# Patient Record
Sex: Female | Born: 1980 | Hispanic: Yes | Marital: Married | Smoking: Never smoker
Health system: Southern US, Community
[De-identification: ages and names within clinical notes are randomized; demographics above are authoritative.]

## PROBLEM LIST (undated history)

## (undated) DIAGNOSIS — D369 Benign neoplasm, unspecified site: Secondary | ICD-10-CM

## (undated) DIAGNOSIS — K219 Gastro-esophageal reflux disease without esophagitis: Secondary | ICD-10-CM

## (undated) DIAGNOSIS — Z789 Other specified health status: Secondary | ICD-10-CM

## (undated) HISTORY — DX: Benign neoplasm, unspecified site: D36.9

---

## 2010-10-16 ENCOUNTER — Ambulatory Visit: Payer: Self-pay

## 2010-10-17 ENCOUNTER — Ambulatory Visit: Payer: Self-pay

## 2010-11-08 ENCOUNTER — Ambulatory Visit: Payer: Self-pay

## 2011-05-09 ENCOUNTER — Ambulatory Visit: Payer: Self-pay

## 2012-02-01 IMAGING — US ULTRASOUND LEFT BREAST
1 series · 17 of 17 positions shown · non-contrast
Comparison: none

REASON FOR EXAM: LT BRST NIPPLE DC [REDACTED]
COMMENTS:

[Series 1: ultrasound left breast · 17 of 17 slices shown]
[im 1/17]
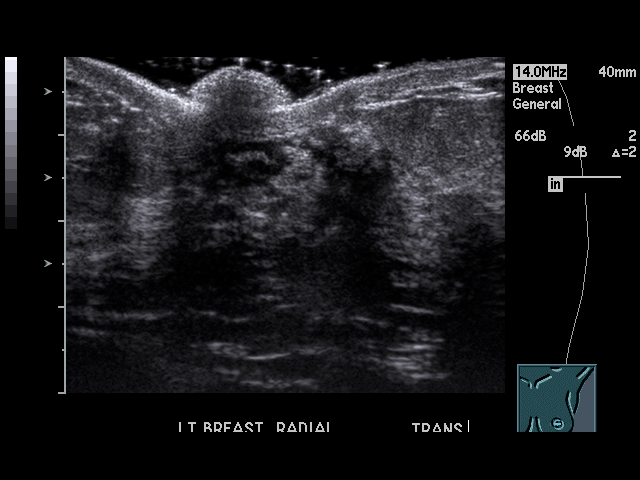
[im 2/17]
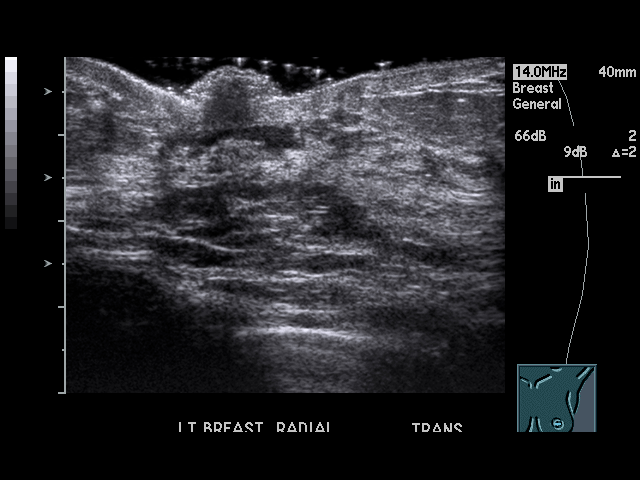
[im 3/17]
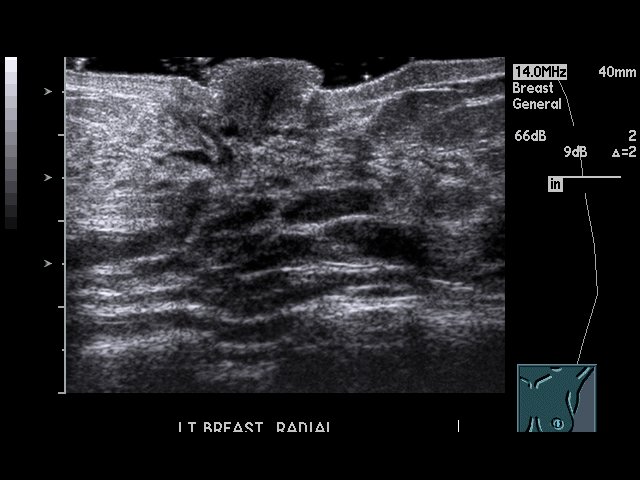
[im 4/17]
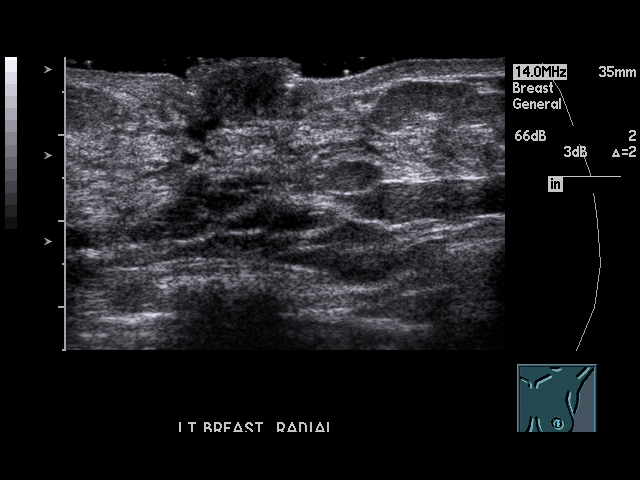
[im 5/17]
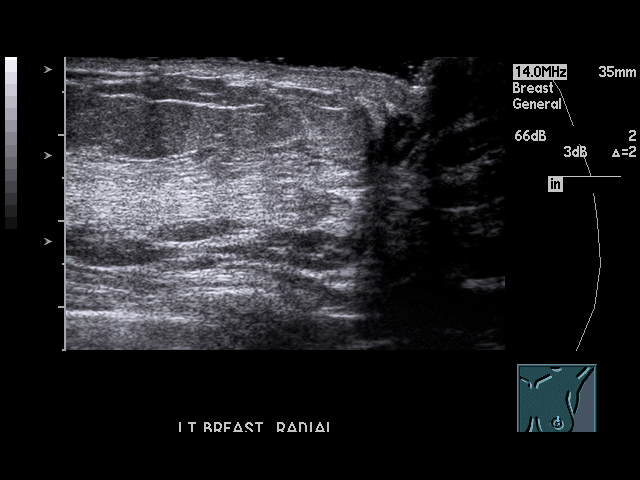
[im 6/17]
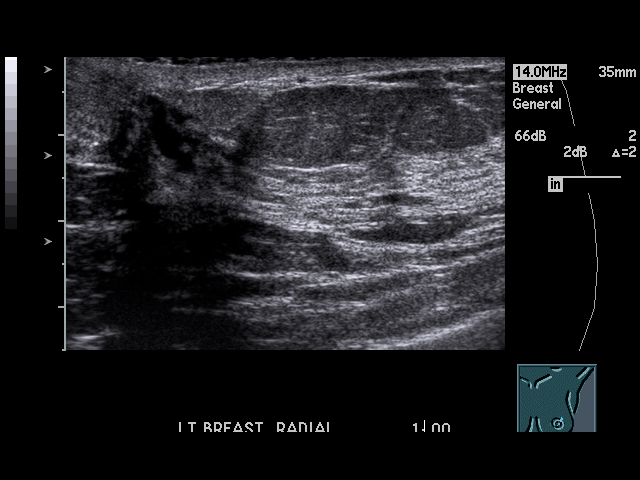
[im 7/17]
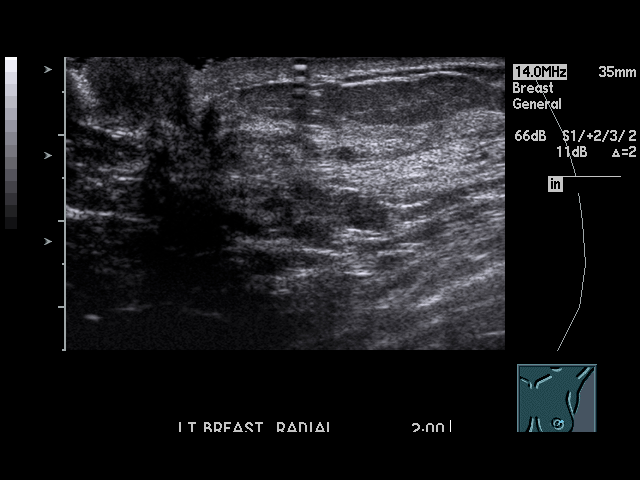
[im 8/17]
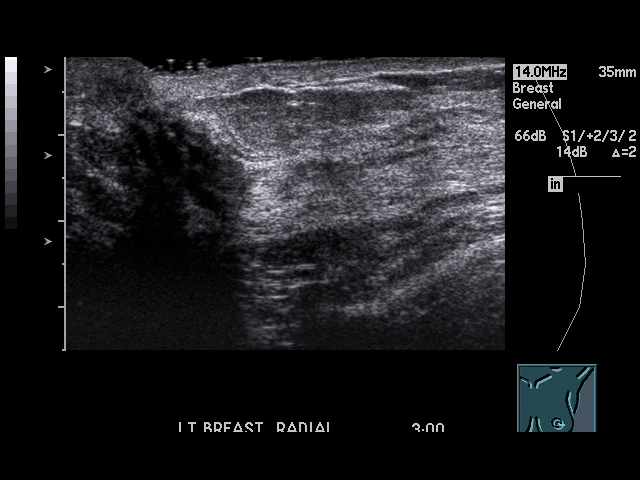
[im 9/17]
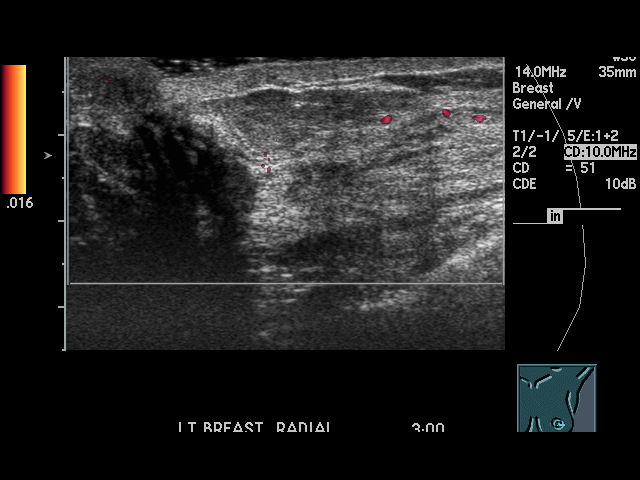
[im 10/17]
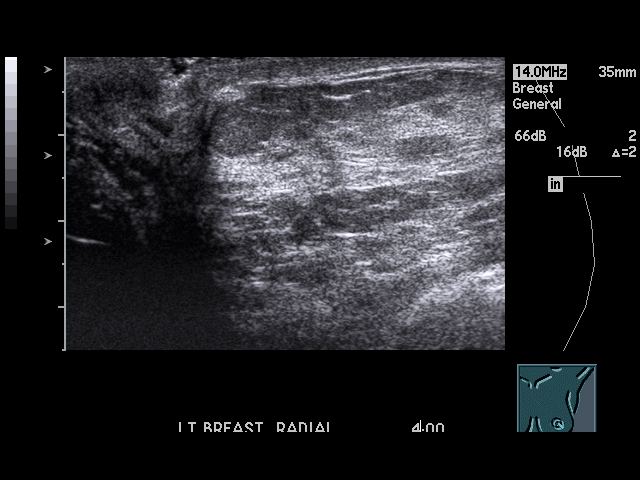
[im 11/17]
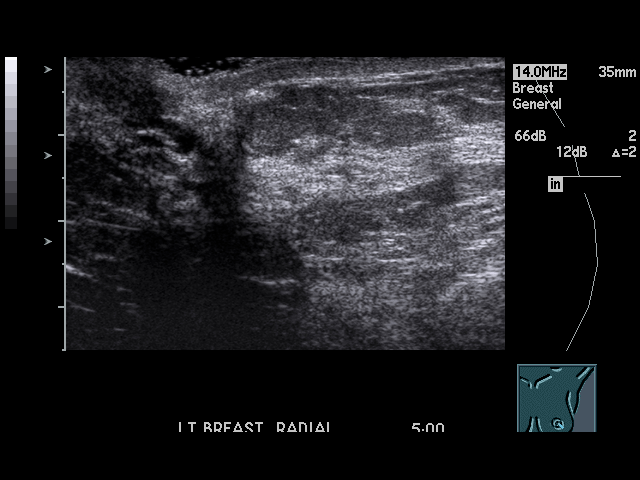
[im 12/17]
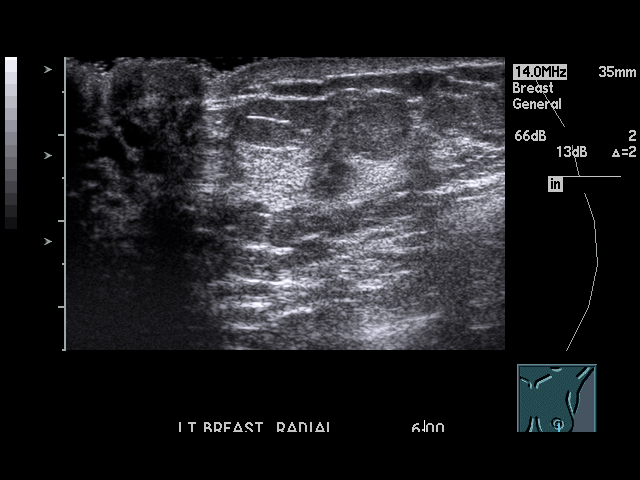
[im 13/17]
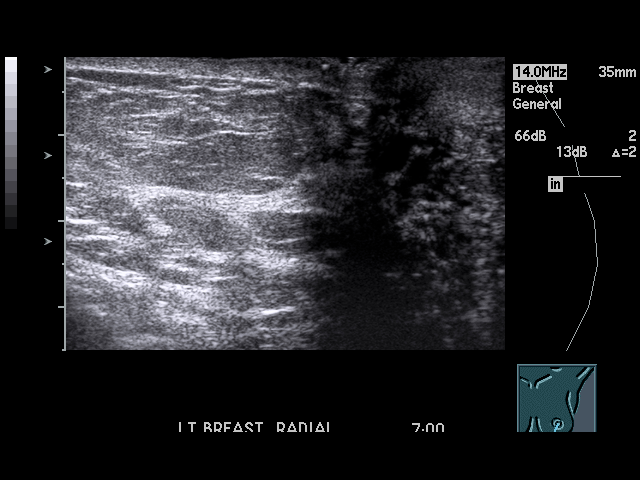
[im 14/17]
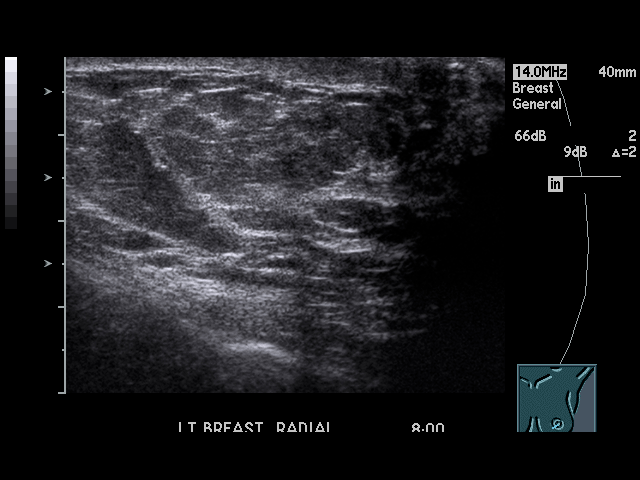
[im 15/17]
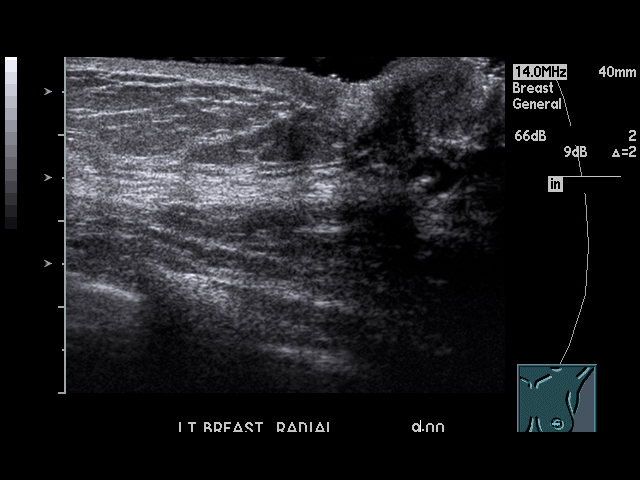
[im 16/17]
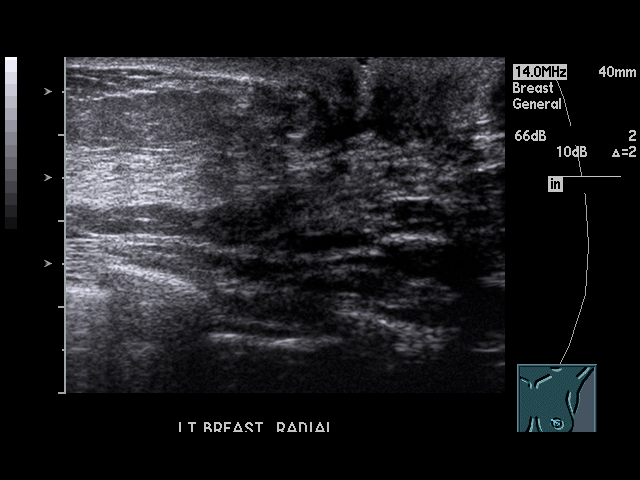
[im 17/17]
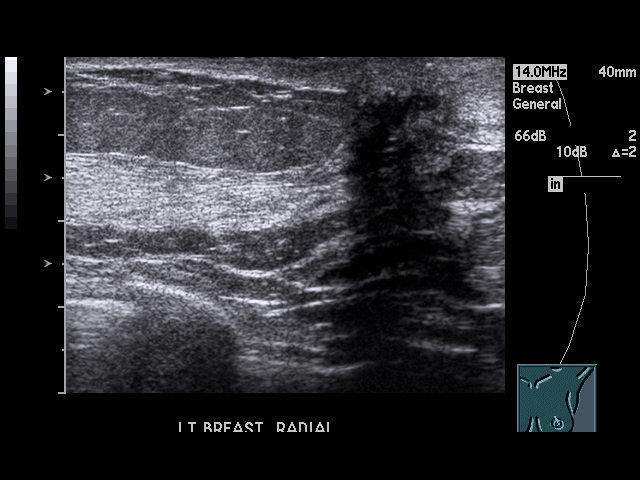

[17 of 17 positions shown; findings below may reference images not displayed]

PROCEDURE:     US  - US LT BREAST ([REDACTED])  - October 17, 2010 [DATE]

RESULT:     The left breast was evaluated in the region of interest in the
periareolar region.  Dilated ducts are identified in the region of the
nipple. Otherwise, no solid or cystic sonographic abnormalities are
identified.
IMPRESSION: 1.Unremarkable focused left breast ultrasound.

## 2012-08-23 IMAGING — US ULTRASOUND LEFT BREAST
1 series · 10 of 10 positions shown · non-contrast
Comparison: none

REASON FOR EXAM: Left Breast Nodule Density Follow Up
COMMENTS:

[Series 1: ultrasound left breast · 10 of 10 slices shown]
[im 1/10]
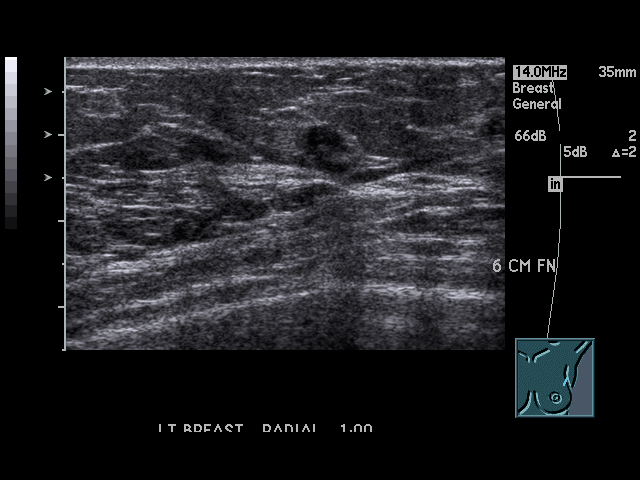
[im 2/10]
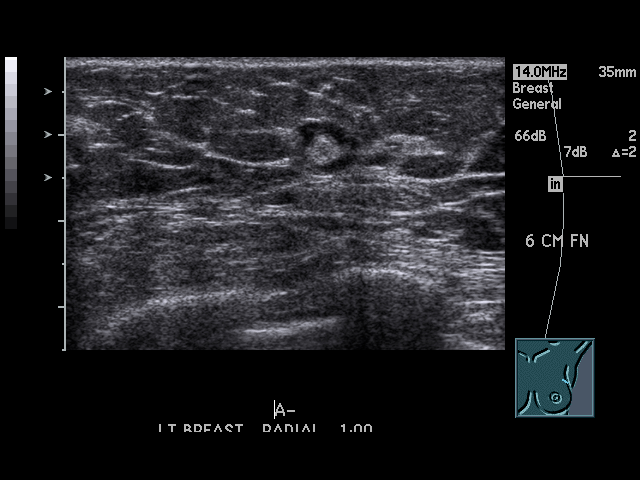
[im 3/10]
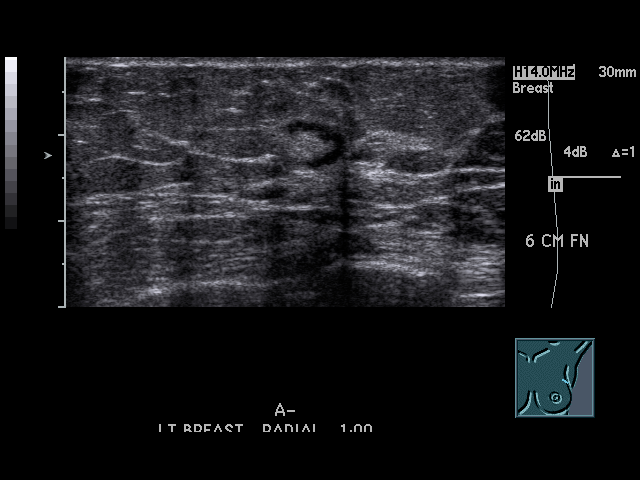
[im 4/10]
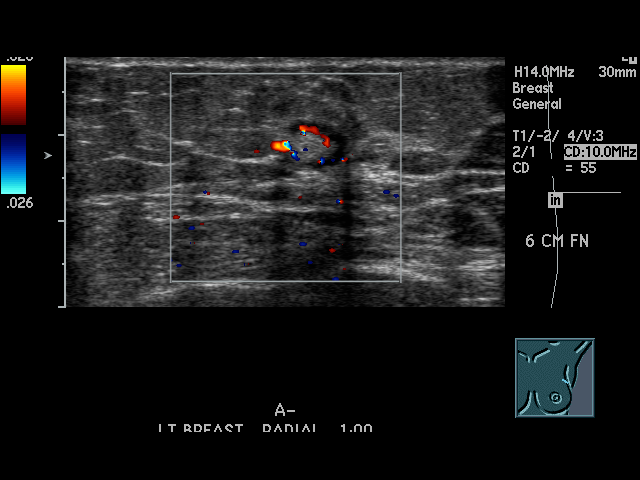
[im 5/10]
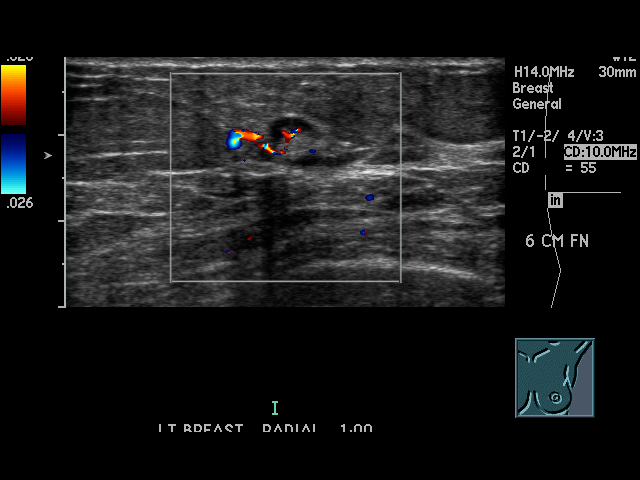
[im 6/10]
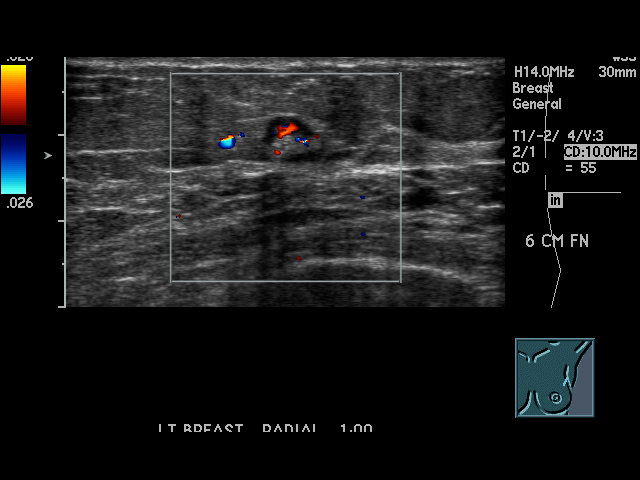
[im 7/10]
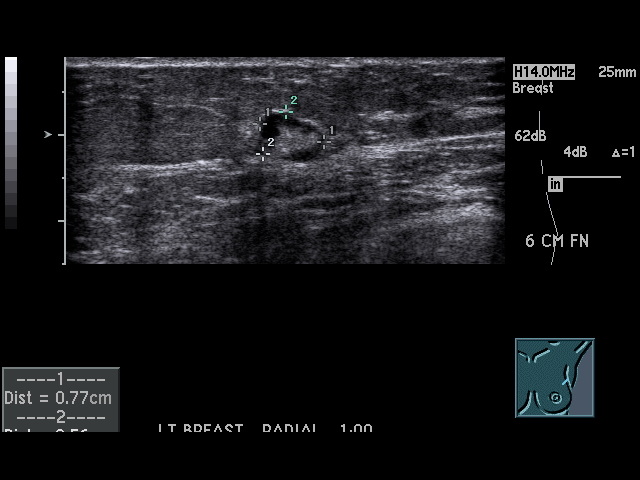
[im 8/10]
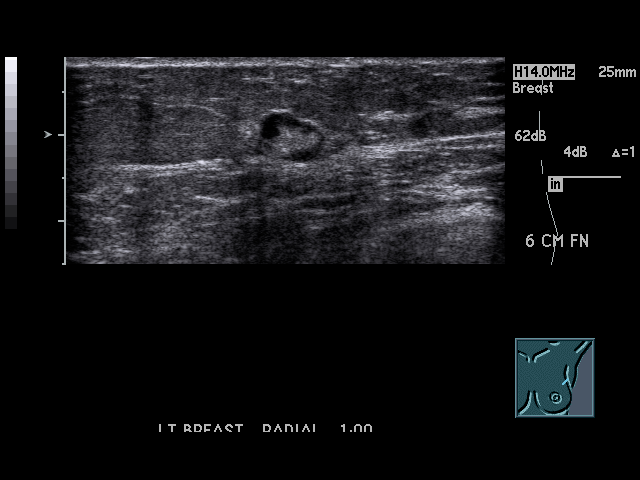
[im 9/10]
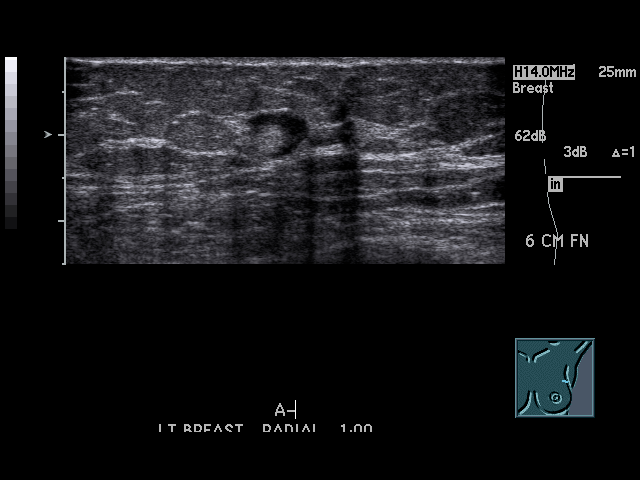
[im 10/10]
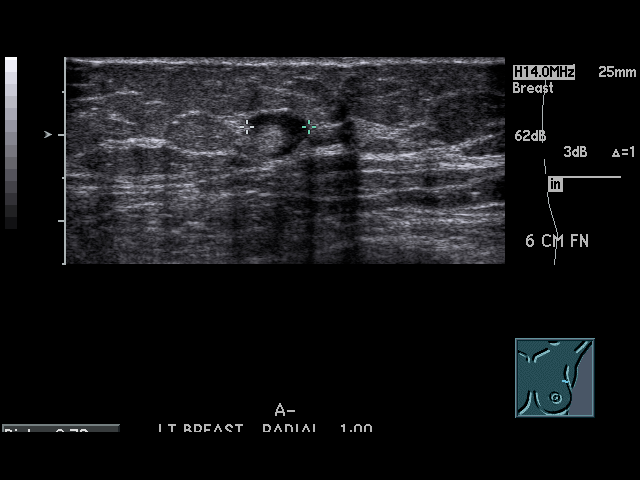

[10 of 10 positions shown; findings below may reference images not displayed]

PROCEDURE:     US  - US BREAST LEFT  - May 09, 2011 [DATE]

RESULT:      Comparison is made to prior mammograms. A 7 mm nodule is noted
in the upper outer aspect of the left breast. This is in the region of
previously identified finding suggesting an intramammary lymph node. Today's
ultrasound reveals a fatty-centered 7 mm lesion consistent with a benign
intramammary lymph node. No further evaluation is indicated.
IMPRESSION: Findings suggesting a benign intramammary lymph node.

## 2012-11-12 ENCOUNTER — Emergency Department: Payer: Self-pay | Admitting: Emergency Medicine

## 2012-11-12 LAB — COMPREHENSIVE METABOLIC PANEL
Anion Gap: 6 — ABNORMAL LOW (ref 7–16)
BUN: 10 mg/dL (ref 7–18)
Calcium, Total: 9.2 mg/dL (ref 8.5–10.1)
Chloride: 104 mmol/L (ref 98–107)
Co2: 26 mmol/L (ref 21–32)
Creatinine: 0.52 mg/dL — ABNORMAL LOW (ref 0.60–1.30)
EGFR (African American): >60
Glucose: 97 mg/dL (ref 65–99)
Osmolality: 271 (ref 275–301)
Potassium: 3.2 mmol/L — ABNORMAL LOW (ref 3.5–5.1)
SGOT(AST): 22 U/L (ref 15–37)

## 2012-11-12 LAB — CBC
MCHC: 35.1 g/dL (ref 32.0–36.0)
MCV: 91 fL (ref 80–100)
RBC: 4.19 10*6/uL (ref 3.80–5.20)

## 2012-11-12 LAB — HCG, QUANTITATIVE, PREGNANCY: Beta Hcg, Quant.: 108192 m[IU]/mL — ABNORMAL HIGH

## 2016-04-03 ENCOUNTER — Ambulatory Visit: Payer: Self-pay | Attending: Oncology

## 2016-04-03 VITALS — BP 123/77 | HR 71 | Temp 96.1°F | Resp 18 | Ht 61.02 in | Wt 134.0 lb

## 2016-04-03 DIAGNOSIS — N6452 Nipple discharge: Secondary | ICD-10-CM

## 2016-04-03 NOTE — Progress Notes (Signed)
Subjective:     Patient ID: Shannon Donaldson, female   DOB: [birth year], 36 y.o.   MRN: [id number]  HPI   Review of Systems     Objective:   Physical Exam  Pulmonary/Chest: Right breast exhibits nipple discharge. Right breast exhibits no inverted nipple, no mass, no skin change and no tenderness. Left breast exhibits nipple discharge. Left breast exhibits no inverted nipple, no mass, no skin change and no tenderness. Breasts are symmetrical.       Assessment:    36 year old referred to [facility] for clear  left nipple discharge. Patient screened, and meets BCCCP eligibility.  Patient does not have insurance, Medicare or Medicaid.  Handout given on Affordable Care Act.Instructed patient on breast self-exam using teach back method.  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  Able to express both clear nipple discharge, and milky discharge left breast, and white opaque discharge from right nipple.    Plan:     Scheduled consult with Dr. Jamal Collin on 04/09/16 at 2:00.  Patient to arrive at 1:45 with photo ID.  Directions, and appointment info given to patient. Jaqui Laukaitis interpreted exam.  Interpreter requested for surgical consult.

## 2016-04-09 ENCOUNTER — Inpatient Hospital Stay: Payer: Self-pay

## 2016-04-09 ENCOUNTER — Ambulatory Visit (INDEPENDENT_AMBULATORY_CARE_PROVIDER_SITE_OTHER): Payer: PRIVATE HEALTH INSURANCE | Admitting: General Surgery

## 2016-04-09 ENCOUNTER — Encounter: Payer: Self-pay | Admitting: General Surgery

## 2016-04-09 VITALS — BP 120/78 | HR 82 | Resp 12 | Ht 61.0 in | Wt 136.0 lb

## 2016-04-09 DIAGNOSIS — N6452 Nipple discharge: Secondary | ICD-10-CM

## 2016-04-09 NOTE — Progress Notes (Signed)
Patient ID: Shannon Donaldson, female   DOB: 1980/11/09, 36 y.o.   MRN: [id number]  Chief Complaint  Patient presents with  . Breast Discharge    HPI Luci Rei is a 36 y.o. female.  Here today referred by Jeanella Anton BCCCP program for clear left nipple discharge. She first noticed this about 2 months ago. Clear in color, no pain. She does preform self breast exams. She notices this with pressure only. No mammograms or ultrasounds. Pt is still breast feeding-mostly with right breast.her cild is 2 and 1/36yrs old. Left breast ultrasound was 2013 for an intramammary lymph node. She is currently breast feeding with the right breast, child is 64 yrs old. Interpreter, Geoffery Spruce, present for interview, exam and discussion. I have reviewed the history of present illness with the patient.  HPI  History reviewed. No pertinent past medical history.  No past surgical history on file.  Family History  Problem Relation Age of Onset  . Breast cancer Neg Hx   . Colon cancer Neg Hx     Social History Social History  Substance Use Topics  . Smoking status: Never Smoker  . Smokeless tobacco: Never Used  . Alcohol use No    No Known Allergies  No current outpatient prescriptions on file.   No current facility-administered medications for this visit.     Review of Systems Review of Systems  Constitutional: Negative.   Respiratory: Negative.   Cardiovascular: Negative.     Blood pressure 120/78, pulse 82, resp. rate 12, height 5\' 1"  (1.549 m), weight 136 lb (61.7 kg), last menstrual period 03/26/2016.  Physical Exam Physical Exam  Constitutional: She is oriented to person, place, and time. She appears well-developed and well-nourished.  HENT:  Mouth/Throat: Oropharynx is clear and moist.  Eyes: Conjunctivae are normal. No scleral icterus.  Neck: Neck supple.  Cardiovascular: Normal rate, regular rhythm and normal heart sounds.   Pulmonary/Chest: Effort normal and breath  sounds normal. Right breast exhibits nipple discharge. Right breast exhibits no inverted nipple, no mass, no skin change and no tenderness. Left breast exhibits nipple discharge. Left breast exhibits no mass, no skin change and no tenderness.  Milky discharge noted bilaterally as well as clear drainage from left nipple upper outer aspect.  Lymphadenopathy:    She has no cervical adenopathy.    She has no axillary adenopathy.  Neurological: She is alert and oriented to person, place, and time.  Skin: Skin is warm and dry.  Psychiatric: Her behavior is normal.    Data Reviewed   BCCCP notes. Targeted left breast US was performed. There is at 1-2 ocl location few dilated ducts with suggestion of a a mass  like area in one. No ducts are visible elsewhere in the left areolar region.  Assessment    Watery discharge from an isolated duct/ducts at 1-2 ocl location on left.    Plan    Pt advised on the possible findings associated with this type of discharge. Recommended excision of the involved duct/ducts. She is agreeable. Procedure risks/benefits explained    The patient is scheduled for surgery at [facility] on 04/16/16. She will pre admit at the hospital on 04/11/16 at 10:15 am. The patient is aware of dates, time, and instructions.  This information has been scribed by Karie Fetch RN, BSN,BC.    Viraat Vanpatten G 04/09/2016, 3:56 PM

## 2016-04-09 NOTE — Patient Instructions (Addendum)
The patient is aware to call back for any questions or concerns.  The patient is scheduled for surgery at [facility] on 04/16/16. She will pre admit at the hospital on 04/11/16 at 10:15 am. The patient is aware of dates, time, and instructions.

## 2016-04-11 ENCOUNTER — Encounter
Admission: RE | Admit: 2016-04-11 | Discharge: 2016-04-11 | Disposition: A | Discharge status: Home / Self Care | Payer: Self-pay | Source: Ambulatory Visit | Attending: General Surgery | Admitting: General Surgery

## 2016-04-11 DIAGNOSIS — Z01818 Encounter for other preprocedural examination: Secondary | ICD-10-CM | POA: Insufficient documentation

## 2016-04-11 HISTORY — DX: Other specified health status: Z78.9

## 2016-04-11 NOTE — Patient Instructions (Signed)
Your procedure is scheduled on: 04/16/16 Tues Su procedimiento est programado para: Report to SDS on 2nd floor medical mall Presntese a: To find out your arrival time please call [phone] between [place] on 04/15/16 Mon. Para saber su hora de llegada por favor llame al [phone] entre la 1PM - 3PM el da:  Remember: Instructions that are not followed completely may result in serious medical risk, up to and including death, or upon the discretion of your surgeon and anesthesiologist your surgery may need to be rescheduled.  Recuerde: Las instrucciones que no se siguen completamente Heritage manager en un riesgo de salud grave, incluyendo hasta la [place] o a discrecin de su cirujano y Environmental health practitioner, su ciruga se puede posponer.   __x__ 1. Do not eat food or drink liquids after midnight. No gum chewing or hard candies.  No coma alimentos ni tome lquidos despus de la medianoche.  No mastique chicle ni caramelos  duros.     __x__ 2. No alcohol for 24 hours before or after surgery.    No tome alcohol durante las 24 horas antes ni despus de la [place].   ____ 3. Bring all medications with you on the day of surgery if instructed.    Lleve todos los medicamentos con usted el da de su ciruga si se le ha indicado as.   _x___ 4. Notify your doctor if there is any change in your medical condition (cold, fever,                             infections).    Informe a su mdico si hay algn cambio en su condicin mdica (resfriado, fiebre, infecciones).   Do not wear jewelry, make-up, hairpins, clips or nail polish.  No use joyas, maquillajes, pinzas/ganchos para el cabello ni esmalte de uas.  Do not wear lotions, powders, or perfumes. You may wear deodorant.  No use lociones, polvos o perfumes.  Puede usar desodorante.    Do not shave 48 hours prior to surgery. Men may shave face and neck.  No se afeite 48 horas antes de la [place].  Los hombres pueden [employer] cara y el cuello.    Do not bring valuables to the hospital.   No lleve objetos [place] is not responsible for any belongings or valuables.  [place] no se hace responsable de ningn tipo de pertenencias u objetos de Geographical information systems officer.               Contacts, dentures or bridgework may not be worn into surgery.  Los lentes de [place], las dentaduras postizas o puentes no se pueden usar en la [place].  Leave your suitcase in the car. After surgery it may be brought to your room.  Deje su maleta en el auto.  Despus de la ciruga podr traerla a su habitacin.  For patients admitted to the hospital, discharge time is determined by your treatment team.  Para los pacientes que sean ingresados al hospital, el tiempo en el cual se le dar de alta es determinado por su                equipo de [place].   Patients discharged the day of surgery will not be allowed to drive home. A los pacientes que se les da de alta el mismo da de la ciruga no se les permitir conducir a Holiday representative.   Please read over the  following fact sheets that you were given: Por favor [place] hojas de informacin que le dieron:     ____ Take these medicines the morning of surgery with A SIP OF WATER:          [employer] estas medicinas la maana de la ciruga con UN SORBO DE AGUA:  1.Nonw  2.   3.   4.       5.  6.  ____ Fleet Enema (as directed)          Enema de Fleet (segn lo indicado)    _x___ Use CHG Soap as directed          Utilice el jabn de CHG segn lo indicado  ____ Use inhalers on the day of surgery          Use los inhaladores el da de la ciruga  ____ Stop metformin 2 days prior to surgery          Deje de tomar el metformin 2 das antes de la ciruga    ____ Take 1/2 of usual insulin dose the night before surgery and none on the morning of surgery           Tome la mitad de la dosis habitual de insulina la noche antes de la [place] y no tome nada en la maana de la              ciruga  ____ Stop Coumadin/Plavix/aspirin on           Deje de tomar el Coumadin/Plavix/aspirina el da:  _x__ Stop Anti-inflammatories on           Deje de tomar antiinflamatorios el da:   ____ Stop supplements until after surgery            Deje de tomar suplementos hasta despus de la ciruga  ____ Bring C-Pap to the hospital          [facility] al hospital

## 2016-04-16 ENCOUNTER — Encounter
Admission: RE | Disposition: A | Discharge status: Home / Self Care | Payer: Self-pay | Source: Ambulatory Visit | Attending: General Surgery

## 2016-04-16 ENCOUNTER — Encounter: Payer: Self-pay | Admitting: *Deleted

## 2016-04-16 ENCOUNTER — Ambulatory Visit: Payer: Self-pay | Admitting: Anesthesiology

## 2016-04-16 ENCOUNTER — Ambulatory Visit
Admission: RE | Admit: 2016-04-16 | Discharge: 2016-04-16 | Disposition: A | Discharge status: Home / Self Care | Payer: Self-pay | Source: Ambulatory Visit | Attending: General Surgery | Admitting: General Surgery

## 2016-04-16 DIAGNOSIS — D242 Benign neoplasm of left breast: Secondary | ICD-10-CM | POA: Diagnosis not present

## 2016-04-16 DIAGNOSIS — D369 Benign neoplasm, unspecified site: Secondary | ICD-10-CM

## 2016-04-16 HISTORY — DX: Benign neoplasm, unspecified site: D36.9

## 2016-04-16 HISTORY — DX: Gastro-esophageal reflux disease without esophagitis: K21.9

## 2016-04-16 HISTORY — PX: BREAST DUCTAL SYSTEM EXCISION: SHX5242

## 2016-04-16 LAB — POCT PREGNANCY, URINE: PREG TEST UR: NEGATIVE

## 2016-04-16 SURGERY — EXCISION DUCTAL SYSTEM BREAST
Anesthesia: Monitor Anesthesia Care | Laterality: Left

## 2016-04-16 MED ORDER — BUPIVACAINE HCL (PF) 0.5 % IJ SOLN
INTRAMUSCULAR | Status: DC | PRN
  Administered 2016-04-16: 18 mL

## 2016-04-16 MED ORDER — FENTANYL CITRATE (PF) 100 MCG/2ML IJ SOLN
INTRAMUSCULAR | Status: AC
  Filled 2016-04-16: qty 2

## 2016-04-16 MED ORDER — LIDOCAINE HCL (PF) 1 % IJ SOLN
INTRAMUSCULAR | Status: AC
  Filled 2016-04-16: qty 30

## 2016-04-16 MED ORDER — DEXAMETHASONE SODIUM PHOSPHATE 10 MG/ML IJ SOLN
INTRAMUSCULAR | Status: AC
  Filled 2016-04-16: qty 1

## 2016-04-16 MED ORDER — MIDAZOLAM HCL 2 MG/2ML IJ SOLN
INTRAMUSCULAR | Status: AC
  Filled 2016-04-16: qty 2

## 2016-04-16 MED ORDER — PROPOFOL 10 MG/ML IV BOLUS
INTRAVENOUS | Status: DC | PRN
  Administered 2016-04-16: 150 mg via INTRAVENOUS

## 2016-04-16 MED ORDER — TRAMADOL HCL 50 MG PO TABS: 50.0000 mg | ORAL_TABLET | Freq: Four times a day (QID) | ORAL | 0 refills | Status: DC | PRN

## 2016-04-16 MED ORDER — FAMOTIDINE 20 MG PO TABS
20.0000 mg | ORAL_TABLET | Freq: Once | ORAL | Status: AC
  Administered 2016-04-16: 20 mg via ORAL

## 2016-04-16 MED ORDER — PROPOFOL 10 MG/ML IV BOLUS
INTRAVENOUS | Status: AC
  Filled 2016-04-16: qty 20

## 2016-04-16 MED ORDER — ONDANSETRON HCL 4 MG/2ML IJ SOLN
INTRAMUSCULAR | Status: AC
  Filled 2016-04-16: qty 2

## 2016-04-16 MED ORDER — LACTATED RINGERS IV SOLN
INTRAVENOUS | Status: DC
  Administered 2016-04-16: 50 mL/h via INTRAVENOUS

## 2016-04-16 MED ORDER — BUPIVACAINE HCL (PF) 0.5 % IJ SOLN
INTRAMUSCULAR | Status: AC
  Filled 2016-04-16: qty 30

## 2016-04-16 MED ORDER — KETOROLAC TROMETHAMINE 30 MG/ML IJ SOLN
INTRAMUSCULAR | Status: AC
  Filled 2016-04-16: qty 1

## 2016-04-16 MED ORDER — ONDANSETRON HCL 4 MG/2ML IJ SOLN
INTRAMUSCULAR | Status: DC | PRN
  Administered 2016-04-16: 4 mg via INTRAVENOUS

## 2016-04-16 MED ORDER — ONDANSETRON HCL 4 MG/2ML IJ SOLN: 4.0000 mg | Freq: Once | INTRAMUSCULAR | Status: DC | PRN

## 2016-04-16 MED ORDER — FENTANYL CITRATE (PF) 100 MCG/2ML IJ SOLN: 25.0000 ug | INTRAMUSCULAR | Status: DC | PRN

## 2016-04-16 MED ORDER — MIDAZOLAM HCL 2 MG/2ML IJ SOLN
INTRAMUSCULAR | Status: DC | PRN
Start: 2016-04-16 — End: 2016-04-16
  Administered 2016-04-16: 2 mg via INTRAVENOUS

## 2016-04-16 MED ORDER — FENTANYL CITRATE (PF) 100 MCG/2ML IJ SOLN
INTRAMUSCULAR | Status: DC | PRN
  Administered 2016-04-16 (×2): 25 ug via INTRAVENOUS
  Administered 2016-04-16: 50 ug via INTRAVENOUS

## 2016-04-16 MED ORDER — ACETAMINOPHEN 10 MG/ML IV SOLN
INTRAVENOUS | Status: AC
  Filled 2016-04-16: qty 100

## 2016-04-16 MED ORDER — DEXAMETHASONE SODIUM PHOSPHATE 10 MG/ML IJ SOLN
INTRAMUSCULAR | Status: DC | PRN
  Administered 2016-04-16: 10 mg via INTRAVENOUS

## 2016-04-16 MED ORDER — FAMOTIDINE 20 MG PO TABS
ORAL_TABLET | ORAL | Status: AC
  Administered 2016-04-16: 20 mg via ORAL
  Filled 2016-04-16: qty 1

## 2016-04-16 SURGICAL SUPPLY — 36 items
BLADE SURG 15 STRL SS SAFETY (BLADE) ×3 IMPLANT
CANISTER SUCT 1200ML W/VALVE (MISCELLANEOUS) ×3 IMPLANT
CHLORAPREP W/TINT 26ML (MISCELLANEOUS) ×3 IMPLANT
CNTNR SPEC 2.5X3XGRAD LEK (MISCELLANEOUS) ×1
CONT SPEC 4OZ STER OR WHT (MISCELLANEOUS) ×2
CONTAINER SPEC 2.5X3XGRAD LEK (MISCELLANEOUS) ×1 IMPLANT
COVER PROBE FLX POLY STRL (MISCELLANEOUS) ×3 IMPLANT
DERMABOND ADVANCED (GAUZE/BANDAGES/DRESSINGS) ×2
DERMABOND ADVANCED .7 DNX12 (GAUZE/BANDAGES/DRESSINGS) ×1 IMPLANT
DEVICE DUBIN SPECIMEN MAMMOGRA (MISCELLANEOUS) ×3 IMPLANT
DEVICE LOCALIZATION ULTRAWIRE (WIRE) IMPLANT
DEVICE ULTRAWIRE LOCAL 19X9 (WIRE) IMPLANT
DRAPE LAPAROTOMY 100X77 ABD (DRAPES) ×3 IMPLANT
ELECT CAUTERY BLADE TIP 2.5 (TIP) ×3
ELECT REM PT RETURN 9FT ADLT (ELECTROSURGICAL) ×3
ELECTRODE CAUTERY BLDE TIP 2.5 (TIP) ×1 IMPLANT
ELECTRODE REM PT RTRN 9FT ADLT (ELECTROSURGICAL) ×1 IMPLANT
GLOVE BIO SURGEON STRL SZ7 (GLOVE) ×3 IMPLANT
GOWN STRL REUS W/ TWL LRG LVL3 (GOWN DISPOSABLE) ×2 IMPLANT
GOWN STRL REUS W/TWL LRG LVL3 (GOWN DISPOSABLE) ×4
HARMONIC SCALPEL FOCUS (MISCELLANEOUS) IMPLANT
KIT RM TURNOVER STRD PROC AR (KITS) ×3 IMPLANT
LABEL OR SOLS (LABEL) ×3 IMPLANT
MARGIN MAP 10MM (MISCELLANEOUS) ×3 IMPLANT
NDL SAFETY 22GX1.5 (NEEDLE) ×3 IMPLANT
NEEDLE HYPO 25X1 1.5 SAFETY (NEEDLE) ×3 IMPLANT
PACK BASIN MINOR ARMC (MISCELLANEOUS) ×3 IMPLANT
SUT ETH BLK MONO 3 0 FS 1 12/B (SUTURE) ×3 IMPLANT
SUT VIC AB 2-0 CT1 (SUTURE) ×3 IMPLANT
SUT VIC AB 3-0 54X BRD REEL (SUTURE) ×1 IMPLANT
SUT VIC AB 3-0 BRD 54 (SUTURE) ×2
SUT VIC AB 4-0 FS2 27 (SUTURE) ×3 IMPLANT
SYR CONTROL 10ML (SYRINGE) ×3 IMPLANT
ULTRAWIRE LOCAL DEVICE 19X9 (WIRE)
ULTRAWIRE LOCALIZATION DEVICE (WIRE)
WATER STERILE IRR 1000ML POUR (IV SOLUTION) ×3 IMPLANT

## 2016-04-16 NOTE — Anesthesia Post-op Follow-up Note (Cosign Needed)
Anesthesia QCDR form completed.        

## 2016-04-16 NOTE — Interval H&P Note (Signed)
History and Physical Interval Note:  04/16/2016 7:45 AM  Shannon Donaldson  has presented today for surgery, with the diagnosis of left nipple discharge  The various methods of treatment have been discussed with the patient and family. After consideration of risks, benefits and other options for treatment, the patient has consented to  Procedure(s): Bier (Left) as a surgical intervention .  The patient's history has been reviewed, patient examined, no change in status, stable for surgery.  I have reviewed the patient's chart and labs.  Questions were answered to the patient's satisfaction.     SANKAR,SEEPLAPUTHUR G

## 2016-04-16 NOTE — Transfer of Care (Signed)
Immediate Anesthesia Transfer of Care Note  Patient: Shannon Donaldson  Procedure(s) Performed: Procedure(s): EXCISION DUCTAL SYSTEM BREAST (Left)  Patient Location: PACU  Anesthesia Type:General  Level of Consciousness: patient cooperative and lethargic  Airway & Oxygen Therapy: Patient Spontanous Breathing and Patient connected to face mask oxygen  Post-op Assessment: Report given to RN and Post -op Vital signs reviewed and stable  Post vital signs: Reviewed and stable  Last Vitals:  Vitals:   04/16/16 0703 04/16/16 0935  BP: 118/72 107/79  Pulse: 77 86  Resp: 14 18  Temp: 36.6 C (!) 36 C    Last Pain:  Vitals:   04/16/16 0703  TempSrc: Oral      Patients Stated Pain Goal: 0 ([id number])  Complications: No apparent anesthesia complications

## 2016-04-16 NOTE — Discharge Instructions (Signed)
CIRUGIA AMBULATORIA       Instruccionnes de alta    Date (Fecha)   1.  Las drogas que se le administraron permaneceran en su cuerpo hasta [place], asi que por las proximas 24 horas usted no debe:   Conducir (manejar) un automovil   Hacer ninguna decision legal   Tomar ninguna bebida alcoholica  2.  A) [place] puede comenzar una dieta regular.  Es mejor que hoy empiece con liquidos y gradualmente anada comidas solidas.       B) Puede comer cualquier comida que desee pero es mejor empezar con liquidos, luego sopitas con galletas saladas y gradualmente llegar a las comidas solidas.  3.  Por favor avise a su medico inmediatamente si usted tiene algun sangrado anormal, tiene dificultad con la respiracion, enrojecimiento y dolor en el sitio de la cirugia, drenaje, fiebro o dolor que se alivia con medicina.  4.  Istrucciones especificas :  

## 2016-04-16 NOTE — Anesthesia Preprocedure Evaluation (Signed)
Anesthesia Evaluation  Patient identified by MRN, date of birth, ID band Patient awake    Reviewed: Allergy & Precautions, H&P , NPO status , Patient's Chart, lab work & pertinent test results, reviewed documented beta blocker date and time   Airway Mallampati: II  TM Distance: >3 FB Neck ROM: full    Dental  (+) Teeth Intact   Pulmonary neg pulmonary ROS,    Pulmonary exam normal        Cardiovascular Exercise Tolerance: Good negative cardio ROS Normal cardiovascular exam Rate:Normal     Neuro/Psych negative neurological ROS  negative psych ROS   GI/Hepatic negative GI ROS, Neg liver ROS, GERD  Medicated,  Endo/Other  negative endocrine ROS  Renal/GU negative Renal ROS  negative genitourinary   Musculoskeletal   Abdominal   Peds  Hematology negative hematology ROS (+)   Anesthesia Other Findings   Reproductive/Obstetrics negative OB ROS                             Anesthesia Physical Anesthesia Plan  ASA: II  Anesthesia Plan: General LMA and MAC   Post-op Pain Management:    Induction:   Airway Management Planned:   Additional Equipment:   Intra-op Plan:   Post-operative Plan:   Informed Consent: I have reviewed the patients History and Physical, chart, labs and discussed the procedure including the risks, benefits and alternatives for the proposed anesthesia with the patient or authorized representative who has indicated his/her understanding and acceptance.     Plan Discussed with: CRNA  Anesthesia Plan Comments:         Anesthesia Quick Evaluation

## 2016-04-16 NOTE — Op Note (Signed)
Preop diagnosis: Left nipple discharge  Post op diagnosis: Same  Operation: Excision of a subareolar duct left breast  Surgeon: Mckinley Jewel  Assistant:     Anesthesia: Gen.  Complications: None  EBL: Less than 10 mL  Drains: None  Description: This patient was put to sleep in supine position the operating table and LMA was utilized. Left breast was prepped and draped as sterile field and timeout performed. This patient had the watery discharge coming from the 1:00 location with ultrasound showing a mildly dilated duct and a questionable masslike area within. She was also breast-feeding although she is more than 2 years out. Recently she has not been breast feeding with via the left breast having noticed this clear discharge. Ultrasound probe was brought up to the field and the duct in question identified of the 1:00 location and in addition pressure of this area produced a watery discharge that was evaluated in in my office before. A circumareolar incision in the upper outer quadrant from the 12 to 3:00 was then planned. Local anesthetic of 0.5% Marcaine was instilled totaling 19 mL. Skin incision was made and the's areolar skin was lifted up towards the base of the nipple. The duct in question with some clear drainage was identified and along with the somewhat the tissue surrounding this in the radial fashion this was excised out. Bleeding was controlled with cautery. Few areas of from milky drainage were cauterized and subsequently sutured closed with the 2-0 Vicryl. After ensuring hemostasis the glandular elements was approximated with interrupted 2-0 Vicryl. Subcutaneous tissue also closed with 2-0 Vicryl. Skin was closed with subcuticular 4-0 Monocryl covered with Dermabond. Procedure was well-tolerated with no immediate problems encountered. She was subsequently returned recovery room stable condition.

## 2016-04-16 NOTE — Anesthesia Procedure Notes (Signed)
Procedure Name: LMA Insertion Date/Time: 04/16/2016 8:31 AM Performed by: Jonna Clark Pre-anesthesia Checklist: Patient identified, Patient being monitored, Timeout performed, Emergency Drugs available and Suction available Patient Re-evaluated:Patient Re-evaluated prior to inductionOxygen Delivery Method: Circle system utilized Preoxygenation: Pre-oxygenation with 100% oxygen Intubation Type: IV induction Ventilation: Mask ventilation without difficulty LMA: LMA inserted LMA Size: 3.5 Tube type: Oral Number of attempts: 1 Placement Confirmation: positive ETCO2 and breath sounds checked- equal and bilateral Tube secured with: Tape Dental Injury: Teeth and Oropharynx as per pre-operative assessment

## 2016-04-16 NOTE — Anesthesia Postprocedure Evaluation (Signed)
Anesthesia Post Note  Patient: Shannon Donaldson  Procedure(s) Performed: Procedure(s) (LRB): EXCISION DUCTAL SYSTEM BREAST (Left)  Patient location during evaluation: PACU Anesthesia Type: MAC Level of consciousness: awake and alert Pain management: pain level controlled Vital Signs Assessment: post-procedure vital signs reviewed and stable Respiratory status: spontaneous breathing, nonlabored ventilation, respiratory function stable and patient connected to nasal cannula oxygen Cardiovascular status: blood pressure returned to baseline and stable Postop Assessment: no signs of nausea or vomiting Anesthetic complications: no     Last Vitals:  Vitals:   04/16/16 1005 04/16/16 1017  BP: 120/75 (P) 131/82  Pulse: 74 (P) 71  Resp: 11 (P) 16  Temp:  (P) 36.1 C    Last Pain:  Vitals:   04/16/16 1017  TempSrc: (P) Temporal                 Molli Barrows

## 2016-04-16 NOTE — H&P (View-Only) (Signed)
Patient ID: Shannon Donaldson, female   DOB: 05-30-1980, 36 y.o.   MRN: [id number]  Chief Complaint  Patient presents with  . Breast Discharge    HPI Shannon Donaldson is a 36 y.o. female.  Here today referred by Jeanella Anton BCCCP program for clear left nipple discharge. She first noticed this about 2 months ago. Clear in color, no pain. She does preform self breast exams. She notices this with pressure only. No mammograms or ultrasounds. Pt is still breast feeding-mostly with right breast.her cild is 2 and 1/36yrs old. Left breast ultrasound was 2013 for an intramammary lymph node. She is currently breast feeding with the right breast, child is 12 yrs old. Interpreter, Geoffery Spruce, present for interview, exam and discussion. I have reviewed the history of present illness with the patient.  HPI  History reviewed. No pertinent past medical history.  No past surgical history on file.  Family History  Problem Relation Age of Onset  . Breast cancer Neg Hx   . Colon cancer Neg Hx     Social History Social History  Substance Use Topics  . Smoking status: Never Smoker  . Smokeless tobacco: Never Used  . Alcohol use No    No Known Allergies  No current outpatient prescriptions on file.   No current facility-administered medications for this visit.     Review of Systems Review of Systems  Constitutional: Negative.   Respiratory: Negative.   Cardiovascular: Negative.     Blood pressure 120/78, pulse 82, resp. rate 12, height 5\' 1"  (1.549 m), weight 136 lb (61.7 kg), last menstrual period 03/26/2016.  Physical Exam Physical Exam  Constitutional: She is oriented to person, place, and time. She appears well-developed and well-nourished.  HENT:  Mouth/Throat: Oropharynx is clear and moist.  Eyes: Conjunctivae are normal. No scleral icterus.  Neck: Neck supple.  Cardiovascular: Normal rate, regular rhythm and normal heart sounds.   Pulmonary/Chest: Effort normal and breath  sounds normal. Right breast exhibits nipple discharge. Right breast exhibits no inverted nipple, no mass, no skin change and no tenderness. Left breast exhibits nipple discharge. Left breast exhibits no mass, no skin change and no tenderness.  Milky discharge noted bilaterally as well as clear drainage from left nipple upper outer aspect.  Lymphadenopathy:    She has no cervical adenopathy.    She has no axillary adenopathy.  Neurological: She is alert and oriented to person, place, and time.  Skin: Skin is warm and dry.  Psychiatric: Her behavior is normal.    Data Reviewed   BCCCP notes. Targeted left breast US was performed. There is at 1-2 ocl location few dilated ducts with suggestion of a a mass  like area in one. No ducts are visible elsewhere in the left areolar region.  Assessment    Watery discharge from an isolated duct/ducts at 1-2 ocl location on left.    Plan    Pt advised on the possible findings associated with this type of discharge. Recommended excision of the involved duct/ducts. She is agreeable. Procedure risks/benefits explained    The patient is scheduled for surgery at [facility] on 04/16/16. She will pre admit at the hospital on 04/11/16 at 10:15 am. The patient is aware of dates, time, and instructions.  This information has been scribed by Karie Fetch RN, BSN,BC.    SANKAR,SEEPLAPUTHUR G 04/09/2016, 3:56 PM

## 2016-04-17 LAB — SURGICAL PATHOLOGY

## 2016-04-18 ENCOUNTER — Telehealth: Payer: Self-pay | Admitting: *Deleted

## 2016-04-18 NOTE — Telephone Encounter (Signed)
-----   Message from Christene Lye, MD sent at 04/18/2016 10:35 AM EST ----- Please inform pt pathology showed a benign mass. No cause for concern

## 2016-04-18 NOTE — Telephone Encounter (Signed)
Spoke with patient's husband today via Medical laboratory scientific officer. He was notified of test results and will inform wife.   Husband was told that wife should keep her follow up appointment as scheduled.

## 2016-04-23 ENCOUNTER — Encounter: Payer: Self-pay | Admitting: General Surgery

## 2016-04-23 ENCOUNTER — Ambulatory Visit (INDEPENDENT_AMBULATORY_CARE_PROVIDER_SITE_OTHER): Payer: PRIVATE HEALTH INSURANCE | Admitting: General Surgery

## 2016-04-23 VITALS — BP 102/64 | HR 70 | Resp 12 | Ht 61.0 in | Wt 134.0 lb

## 2016-04-23 DIAGNOSIS — D242 Benign neoplasm of left breast: Secondary | ICD-10-CM

## 2016-04-23 NOTE — Progress Notes (Signed)
Patient ID: Shannon Donaldson, female   DOB: 02/05/1981, 36 y.o.   MRN: [id number]  Chief Complaint  Patient presents with  . Routine Post Op    HPI Shannon Donaldson is a 36 y.o. female.  Here today for postoperative visit following a left ductal excision. She states she is doing well. Denies any nipple discharge or pain in the left breast. States she is only breast feeding from the right breast.   Interpreter,Otto, present for interview, exam and discussion. I have reviewed the history of present illness with the patient.  HPI  Past Medical History:  Diagnosis Date  . GERD (gastroesophageal reflux disease)    occasionally  . 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 Medical history non-contributory     Past Surgical History:  Procedure Laterality Date  . BREAST DUCTAL SYSTEM EXCISION Left 04/16/2016    INTRADUCTAL PAPILLOMA, 0.8 CM/EXCISION DUCTAL SYSTEM BREAST;  Surgeon: Christene Lye, MD;  Location: [facility] ORS;  Service: General;  Laterality: Left;    Family History  Problem Relation Age of Onset  . Breast cancer Neg Hx   . Colon cancer Neg Hx     Social History Social History  Substance Use Topics  . Smoking status: Never Smoker  . Smokeless tobacco: Never Used  . Alcohol use No    No Known Allergies  No current outpatient prescriptions on file.   No current facility-administered medications for this visit.     Review of Systems Review of Systems  Constitutional: Negative.     Blood pressure 102/64, pulse 70, resp. rate 12, height 5\' 1"  (1.549 m), weight 134 lb (60.8 kg), last menstrual period 03/26/2016.  Physical Exam Physical Exam  Constitutional: She is oriented to person, place, and time. She appears well-developed and well-nourished.  Pulmonary/Chest:    Neurological: She is alert and oriented to person, place, and time.  Skin: Skin is warm and dry.  Psychiatric: Her behavior is normal.    Data Reviewed Pathology  showed an intraductal papilloma. Patient advised on this.  Notes reviewed   Assessment      Stable postoperative exam. Intraductal papilloma left breast.  Plan    Follow up in 6 weeks. The patient is aware to call back for any questions or concerns.      This information has been scribed by Karie Fetch RN, BSN,BC.   Shannon Donaldson 04/23/2016, 1:49 PM

## 2016-04-23 NOTE — Patient Instructions (Signed)
The patient is aware to call back for any questions or concerns.  

## 2016-05-14 NOTE — Progress Notes (Signed)
Per Dr. Angie Fava note, patient had benign biopsy.  She is to return to office on 06/04/16  for follow-up.  Copy to [facility].

## 2016-06-04 ENCOUNTER — Encounter: Payer: Self-pay | Admitting: General Surgery

## 2016-06-04 ENCOUNTER — Ambulatory Visit (INDEPENDENT_AMBULATORY_CARE_PROVIDER_SITE_OTHER): Payer: PRIVATE HEALTH INSURANCE | Admitting: General Surgery

## 2016-06-04 VITALS — BP 102/64 | HR 82 | Resp 14 | Ht 61.0 in | Wt 139.0 lb

## 2016-06-04 DIAGNOSIS — D242 Benign neoplasm of left breast: Secondary | ICD-10-CM

## 2016-06-04 NOTE — Patient Instructions (Addendum)
The patient is aware to call back for any questions or concerns.    Follow in 4 months with office ultrasound.

## 2016-06-04 NOTE — Progress Notes (Signed)
Patient ID: Shannon Donaldson, female   DOB: 1980-07-04, 36 y.o.   MRN: [id number]  Chief Complaint  Patient presents with  . Follow-up    HPI Shannon Donaldson is a 36 y.o. female.  Here today for follow up excision intraductal papilloma 04-16-16. No breast complaints and no nipple discharge. She is still breast feeding from the right breast. She has not noted any watery or serous discharge from left nipple. Interpreter, Chip Boer, present for interview, exam and discussion.  HPI  Past Medical History:  Diagnosis Date  . GERD (gastroesophageal reflux disease)    occasionally  . 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 Medical history non-contributory     Past Surgical History:  Procedure Laterality Date  . BREAST DUCTAL SYSTEM EXCISION Left 04/16/2016    INTRADUCTAL PAPILLOMA, 0.8 CM/EXCISION DUCTAL SYSTEM BREAST;  Surgeon: Christene Lye, MD;  Location: [facility] ORS;  Service: General;  Laterality: Left;    Family History  Problem Relation Age of Onset  . Breast cancer Neg Hx   . Colon cancer Neg Hx     Social History Social History  Substance Use Topics  . Smoking status: Never Smoker  . Smokeless tobacco: Never Used  . Alcohol use No    No Known Allergies  No current outpatient prescriptions on file.   No current facility-administered medications for this visit.     Review of Systems Review of Systems  Constitutional: Negative.   Respiratory: Negative.   Cardiovascular: Negative.     Blood pressure 102/64, pulse 82, resp. rate 14, height 5\' 1"  (1.549 m), weight 139 lb (63 kg), last menstrual period 05/17/2016.  Physical Exam Physical Exam  Constitutional: She is oriented to person, place, and time. She appears well-developed and well-nourished.  Pulmonary/Chest: Left breast exhibits no inverted nipple, no mass, no nipple discharge, no skin change and no tenderness.  Left breast incision well healed.  Lymphadenopathy:    She has no  axillary adenopathy.  Neurological: She is alert and oriented to person, place, and time.  Skin: Skin is warm and dry.  Psychiatric: Her behavior is normal.    Data Reviewed Notes reviewed   Assessment    Stable postoperative exam. Intraductal papilloma left breast.     Plan    Follow in 4 months with office ultrasound.    HPI, Physical Exam, Assessment and Plan have been scribed under the direction and in the presence of Mckinley Jewel, MD  Karie Fetch, RN I have completed the exam and reviewed the above documentation for accuracy and completeness.  I agree with the above.  Haematologist has been used and any errors in dictation or transcription are unintentional.  Darbie Biancardi G. Jamal Collin, M.D., F.A.C.S.    Junie Panning G 06/10/2016, 9:04 AM

## 2016-10-08 ENCOUNTER — Ambulatory Visit: Payer: PRIVATE HEALTH INSURANCE | Admitting: General Surgery

## 2016-10-16 ENCOUNTER — Encounter: Payer: Self-pay | Admitting: *Deleted

## 2020-05-19 ENCOUNTER — Emergency Department: Payer: Self-pay

## 2020-05-19 ENCOUNTER — Emergency Department
Admission: EM | Admit: 2020-05-19 | Discharge: 2020-05-19 | Disposition: A | Discharge status: Home / Self Care | Payer: Self-pay | Attending: Emergency Medicine | Admitting: Emergency Medicine

## 2020-05-19 ENCOUNTER — Encounter: Payer: Self-pay | Admitting: Emergency Medicine

## 2020-05-19 ENCOUNTER — Other Ambulatory Visit: Payer: Self-pay

## 2020-05-19 DIAGNOSIS — N760 Acute vaginitis: Secondary | ICD-10-CM

## 2020-05-19 DIAGNOSIS — O034 Incomplete spontaneous abortion without complication: Secondary | ICD-10-CM

## 2020-05-19 DIAGNOSIS — O2341 Unspecified infection of urinary tract in pregnancy, first trimester: Secondary | ICD-10-CM

## 2020-05-19 DIAGNOSIS — O0338 Urinary tract infection following incomplete spontaneous abortion: Secondary | ICD-10-CM | POA: Insufficient documentation

## 2020-05-19 DIAGNOSIS — B9689 Other specified bacterial agents as the cause of diseases classified elsewhere: Secondary | ICD-10-CM

## 2020-05-19 DIAGNOSIS — R102 Pelvic and perineal pain: Secondary | ICD-10-CM

## 2020-05-19 DIAGNOSIS — Z3A01 Less than 8 weeks gestation of pregnancy: Secondary | ICD-10-CM | POA: Insufficient documentation

## 2020-05-19 DIAGNOSIS — O23591 Infection of other part of genital tract in pregnancy, first trimester: Secondary | ICD-10-CM | POA: Insufficient documentation

## 2020-05-19 DIAGNOSIS — N939 Abnormal uterine and vaginal bleeding, unspecified: Secondary | ICD-10-CM

## 2020-05-19 LAB — URINALYSIS, COMPLETE (UACMP) WITH MICROSCOPIC
Bilirubin Urine: NEGATIVE
Glucose, UA: NEGATIVE mg/dL
Ketones, ur: NEGATIVE mg/dL
Nitrite: NEGATIVE
Protein, ur: 30 mg/dL — AB
RBC / HPF: >50 RBC/hpf — ABNORMAL HIGH (ref 0–5)
Specific Gravity, Urine: 1.019 (ref 1.005–1.030)
pH: 6 (ref 5.0–8.0)

## 2020-05-19 LAB — BASIC METABOLIC PANEL
Anion gap: 8 (ref 5–15)
BUN: 10 mg/dL (ref 6–20)
CO2: 24 mmol/L (ref 22–32)
Calcium: 9.5 mg/dL (ref 8.9–10.3)
Chloride: 105 mmol/L (ref 98–111)
Creatinine, Ser: 0.55 mg/dL (ref 0.44–1.00)
GFR, Estimated: >60 mL/min (ref >60)
Glucose, Bld: 96 mg/dL (ref 70–99)
Potassium: 3.5 mmol/L (ref 3.5–5.1)
Sodium: 137 mmol/L (ref 135–145)

## 2020-05-19 LAB — HCG, QUANTITATIVE, PREGNANCY: hCG, Beta Chain, Quant, S: 19597 m[IU]/mL — ABNORMAL HIGH (ref <5)

## 2020-05-19 LAB — CBC WITH DIFFERENTIAL/PLATELET
Abs Immature Granulocytes: 0.01 10*3/uL (ref 0.00–0.07)
Basophils Absolute: 0 10*3/uL (ref 0.0–0.1)
Basophils Relative: 0 %
Eosinophils Absolute: 0.2 10*3/uL (ref 0.0–0.5)
Eosinophils Relative: 3 %
HCT: 36.9 % (ref 36.0–46.0)
Hemoglobin: 12.5 g/dL (ref 12.0–15.0)
Immature Granulocytes: 0 %
Lymphocytes Relative: 37 %
Lymphs Abs: 2.7 10*3/uL (ref 0.7–4.0)
MCH: 31.3 pg (ref 26.0–34.0)
MCHC: 33.9 g/dL (ref 30.0–36.0)
MCV: 92.5 fL (ref 80.0–100.0)
Monocytes Absolute: 0.5 10*3/uL (ref 0.1–1.0)
Monocytes Relative: 6 %
Neutro Abs: 3.9 10*3/uL (ref 1.7–7.7)
Neutrophils Relative %: 54 %
Platelets: 266 10*3/uL (ref 150–400)
RBC: 3.99 MIL/uL (ref 3.87–5.11)
RDW: 13.4 % (ref 11.5–15.5)
WBC: 7.2 10*3/uL (ref 4.0–10.5)
nRBC: 0 % (ref 0.0–0.2)

## 2020-05-19 LAB — WET PREP, GENITAL
Sperm: NONE SEEN
Trich, Wet Prep: NONE SEEN
Yeast Wet Prep HPF POC: NONE SEEN

## 2020-05-19 LAB — CHLAMYDIA/NGC RT PCR (ARMC ONLY)
Chlamydia Tr: NOT DETECTED
N gonorrhoeae: NOT DETECTED

## 2020-05-19 LAB — ABO/RH: ABO/RH(D): O POS

## 2020-05-19 LAB — POC URINE PREG, ED: Preg Test, Ur: POSITIVE — AB

## 2020-05-19 MED ORDER — CEPHALEXIN 500 MG PO CAPS: 500.0000 mg | ORAL_CAPSULE | Freq: Three times a day (TID) | ORAL | 0 refills | Status: AC

## 2020-05-19 MED ORDER — HYDROCODONE-ACETAMINOPHEN 5-325 MG PO TABS: 1.0000 | ORAL_TABLET | Freq: Three times a day (TID) | ORAL | 0 refills | Status: AC | PRN

## 2020-05-19 MED ORDER — CEPHALEXIN 500 MG PO CAPS
500.0000 mg | ORAL_CAPSULE | Freq: Once | ORAL | Status: AC
  Administered 2020-05-19: 500 mg via ORAL
  Filled 2020-05-19: qty 1

## 2020-05-19 MED ORDER — ONDANSETRON 4 MG PO TBDP: 4.0000 mg | ORAL_TABLET | Freq: Three times a day (TID) | ORAL | 0 refills | Status: AC | PRN

## 2020-05-19 MED ORDER — METRONIDAZOLE 500 MG PO TABS
500.0000 mg | ORAL_TABLET | Freq: Once | ORAL | Status: AC
  Administered 2020-05-19: 500 mg via ORAL
  Filled 2020-05-19: qty 1

## 2020-05-19 MED ORDER — METRONIDAZOLE 500 MG PO TABS: 500.0000 mg | ORAL_TABLET | Freq: Two times a day (BID) | ORAL | 0 refills | Status: AC

## 2020-05-19 NOTE — ED Triage Notes (Signed)
First Nurse Note:  C/O vaginal bleeding today.  Patient states she is [redacted] weeks pregnant.   AAOx3.  Skin warm and dry.  NAD

## 2020-05-19 NOTE — ED Provider Notes (Addendum)
[facility] Emergency Department Provider Note ____________________________________________  Time seen: 1730  I have reviewed the triage vital signs and the nursing notes.  HISTORY  History limited by Spanish language, tele-interpreter used for interview and exam.  Chief Complaint  Vaginal Bleeding and Abdominal Pain  HPI Shannon Donaldson is a 40 y.o. female G3P0 presents herself to the ED for evaluation of vaginal bleeding.  Patient reports to be about [redacted] weeks pregnant.  She reports noticing abdominal pain and blood this morning when she wiped.  She denies any excessive blood flow and has not had to use a pad for the vaginal bleeding she describes.  Patient does not establish care with a local OB provider at this time.  Past Medical History:  Diagnosis Date  . GERD (gastroesophageal reflux disease)    occasionally  . 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 Medical history non-contributory     There are no problems to display for this patient.   Past Surgical History:  Procedure Laterality Date  . BREAST DUCTAL SYSTEM EXCISION Left 04/16/2016    INTRADUCTAL PAPILLOMA, 0.8 CM/EXCISION DUCTAL SYSTEM BREAST;  Surgeon: Christene Lye, MD;  Location: [facility] ORS;  Service: General;  Laterality: Left;    Prior to Admission medications   Medication Sig Start Date End Date Taking? Authorizing Provider  cephALEXin (KEFLEX) 500 MG capsule Take 1 capsule (500 mg total) by mouth 3 (three) times daily for 7 days. 05/19/20 05/26/20 Yes , Dannielle Karvonen, PA-C  HYDROcodone-acetaminophen (NORCO) 5-325 MG tablet Take 1 tablet by mouth 3 (three) times daily as needed. 05/19/20  Yes , Dannielle Karvonen, PA-C  metroNIDAZOLE (FLAGYL) 500 MG tablet Take 1 tablet (500 mg total) by mouth 2 (two) times daily for 7 days. 05/19/20 05/26/20 Yes , Dannielle Karvonen, PA-C  ondansetron (ZOFRAN ODT) 4 MG disintegrating tablet Take 1 tablet (4 mg total) by mouth  every 8 (eight) hours as needed. 05/19/20  Yes , Dannielle Karvonen, PA-C    Allergies Patient has no known allergies.  Family History  Problem Relation Age of Onset  . Breast cancer Neg Hx   . Colon cancer Neg Hx     Social History Social History   Tobacco Use  . Smoking status: Never Smoker  . Smokeless tobacco: Never Used  Substance Use Topics  . Alcohol use: No  . Drug use: No    Review of Systems  Constitutional: Negative for fever. Cardiovascular: Negative for chest pain. Respiratory: Negative for shortness of breath. Gastrointestinal: Negative for abdominal pain, vomiting and diarrhea. Genitourinary: Negative for dysuria. Reports vaginal bleeding Musculoskeletal: Negative for back pain. Skin: Negative for rash. Neurological: Negative for headaches, focal weakness or numbness. ____________________________________________  PHYSICAL EXAM:  VITAL SIGNS: ED Triage Vitals  Enc Vitals Group     BP 05/19/20 1650 140/71     Pulse Rate 05/19/20 1650 82     Resp 05/19/20 1650 16     Temp 05/19/20 1654 98.6 F (37 C)     Temp Source 05/19/20 1654 Oral     SpO2 05/19/20 1650 99 %     Weight 05/19/20 1650 147 lb (66.7 kg)     Height 05/19/20 1650 5\' 1"  (1.549 m)     Head Circumference --      Peak Flow --      Pain Score 05/19/20 1650 5     Pain Loc --      Pain Edu? --  Excl. in [place]? --     Constitutional: Alert and oriented. Well appearing and in no distress. Head: Normocephalic and atraumatic. Eyes: Conjunctivae are normal. Normal extraocular movements Cardiovascular: Normal rate, regular rhythm. Normal distal pulses. Respiratory: Normal respiratory effort. No wheezes/rales/rhonchi. Gastrointestinal: Soft and nontender. No distention. GU: Normal external genitalia.  Patient with dark blood in the vault, with mucoid discharge at the cervical os.  No cervical motion tenderness is appreciated.  No adnexal masses are noted. Musculoskeletal: Nontender with  normal range of motion in all extremities.  Neurologic:  Normal gait without ataxia. Normal speech and language. No gross focal neurologic deficits are appreciated. Skin:  Skin is warm, dry and intact. No rash noted. Psychiatric: Mood and affect are normal. Patient exhibits appropriate insight and judgment. ____________________________________________   LABS (pertinent positives/negatives)  Labs Reviewed  WET PREP, GENITAL - Abnormal; Notable for the following components:      Result Value   Clue Cells Wet Prep HPF POC PRESENT (*)    WBC, Wet Prep HPF POC FEW (*)    All other components within normal limits  HCG, QUANTITATIVE, PREGNANCY - Abnormal; Notable for the following components:   hCG, Beta Chain, Quant, S 19,597 (*)    All other components within normal limits  URINALYSIS, COMPLETE (UACMP) WITH MICROSCOPIC - Abnormal; Notable for the following components:   Color, Urine YELLOW (*)    APPearance CLOUDY (*)    Hgb urine dipstick LARGE (*)    Protein, ur 30 (*)    Leukocytes,Ua TRACE (*)    RBC / HPF >50 (*)    Bacteria, UA MANY (*)    All other components within normal limits  POC URINE PREG, ED - Abnormal; Notable for the following components:   Preg Test, Ur POSITIVE (*)    All other components within normal limits  CHLAMYDIA/NGC RT PCR ([facility] ONLY)  URINE CULTURE  CBC WITH DIFFERENTIAL/PLATELET  BASIC METABOLIC PANEL  ABO/RH  ____________________________________________   RADIOLOGY  US OB <14 weeks w/ Transvaginal  IMPRESSION: Single intrauterine gestational sac with slight flattening. Nonviable embryo without cardiac activity. Crown-rump length consistent with gestational age of [redacted] weeks 0 days.  I, Melvenia Needles, personally viewed and evaluated these images (plain radiographs) as part of my medical decision making, as well as reviewing the written report by the radiologist. ____________________________________________  PROCEDURES  Keflex  500 mg PO Flagyl 500 mg PO  Procedures ____________________________________________  INITIAL IMPRESSION / ASSESSMENT AND PLAN / ED COURSE  Differential diagnosis includes, but is not limited to, threatened miscarriage, incomplete miscarriage, normal bleeding from an early trimester pregnancy, ectopic pregnancy, , blighted ovum, vaginal/cervical trauma, subchorionic hemorrhage/hematoma, etc. ----------------------------------------- 8:38 PM on 05/19/2020 ----------------------------------------- S/w Dr. Gilman Schmidt (OB): She suggested that the patient be given time to digest the deficit news.  She also offered the patient could take misoprostol, my desire for medical management.  She offered the patient can certainly follow-up with her in the office next week for surgical intervention as well.  Patient with ED evaluation of vaginal bleeding at [redacted] weeks gestation based on dates.  Patient was found to have 7-week crown-rump length with no cardiac activity identified on the single gestational sac.  Patient and her husband were made aware of the results and allowed time to reflect and ask questions.  Patient was also found exam to have clue cells as well as some bacteriuria.  Should be treated empirically for both at this time with Keflex and metronidazole, respectively.  Patient  is advised to follow-up with a GYN provider, return to the ED immediately for vaginal pain, fever, excessive bleeding greater than 1 pad per hour.  She verbalized understanding and is discharged at this time.  Clinical Course as of 05/19/20 2134  Fri May 19, 2020  1734 [address] rt PCR [facility] only) [JM]    Clinical Course User Index [JM] Melvenia Needles, PA-C    AMIYAH SHRYOCK was evaluated in Emergency Department on 05/19/2020 for the symptoms described in the history of present illness. She was evaluated in the context of the global COVID-19 pandemic, which necessitated consideration that the patient might  be at risk for infection with the SARS-CoV-2 virus that causes COVID-19. Institutional protocols and algorithms that pertain to the evaluation of patients at risk for COVID-19 are in a state of rapid change based on information released by regulatory bodies including the CDC and federal and state organizations. These policies and algorithms were followed during the patient's care in the ED. ____________________________________________  FINAL CLINICAL IMPRESSION(S) / ED DIAGNOSES  Final diagnoses:  Vaginal bleeding  Pelvic pain  Incomplete abortion  UTI (urinary tract infection) during pregnancy, first trimester  BV (bacterial vaginosis)      Carmie End, Dannielle Karvonen, PA-C 05/19/20 2134    Melvenia Needles, PA-C 05/19/20 2135    Melvenia Needles, PA-C 05/19/20 2148    Lavonia Drafts, MD 05/29/20 [phone]

## 2020-05-19 NOTE — Discharge Instructions (Addendum)
Lo siento por la noticia. Tome los 2 antibiticos segn las indicaciones. Tome el medicamento para el dolor y las nuseas segn sea necesario. Seguimiento con el Dr. Gilman Schmidt en su oficina la prxima semana como se discuti. Regrese al servicio de urgencias por cualquier signo de aumento del sangrado, fiebre o dolor plvico.  Take the 2 antibiotics as directed.  Take the pain & nausea medicine as needed.  Follow-up with Dr. Gilman Schmidt in her office next week as discussed. Return to the ED for any signs of increased bleeding, fevers, or pelvic pain.

## 2020-05-19 NOTE — ED Notes (Signed)
Pt triaged using [facility]  Interpreter [place]  ID [phone]

## 2020-05-19 NOTE — ED Notes (Signed)
Patient alert and oriented. Ambulated to the room and bathroom. In no acute distress. Will continue to monitor and assess.

## 2020-05-19 NOTE — ED Triage Notes (Signed)
Pt to ED via POV stating that she is [redacted] weeks pregnant. Pt states that this morning she started having vaginal bleeding and abdominal pain. Pt states that she is not passing blood clots. Pt reports that she only has bleeding when she wipes and is not having to use a pad. Pt is in NAD.

## 2020-05-21 LAB — URINE CULTURE: Special Requests: NORMAL

## 2021-09-03 IMAGING — US US OB < 14 WEEKS - US OB TV
1 series · 14 of 28 positions shown · non-contrast
Comparison: None.

CLINICAL DATA: Vaginal bleeding and pelvic pain.  Early pregnancy.

EXAM:
OBSTETRIC <14 WK US AND TRANSVAGINAL OB US
TECHNIQUE: Both transabdominal and transvaginal ultrasound examinations were
performed for complete evaluation of the gestation as well as the
maternal uterus, adnexal regions, and pelvic cul-de-sac.
Transvaginal technique was performed to assess early pregnancy.

[Series 1: us ob less than 14 weeks with ob transvaginal · 14 of 98 slices shown]
[im 4/98]
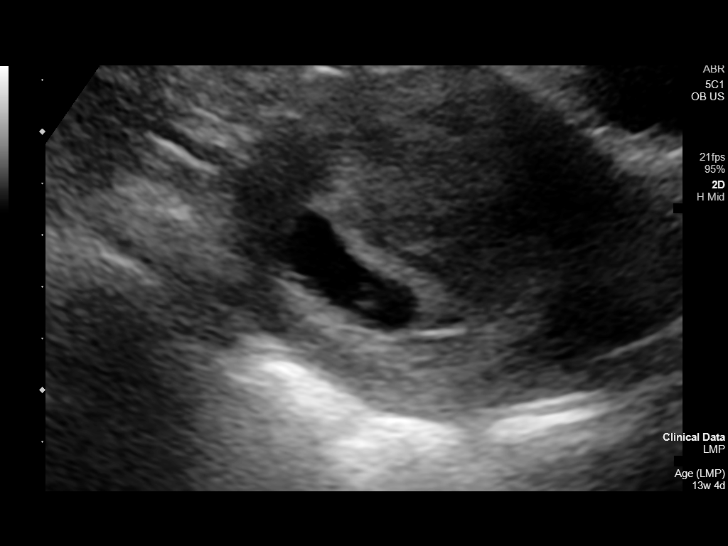
[im 11/98]
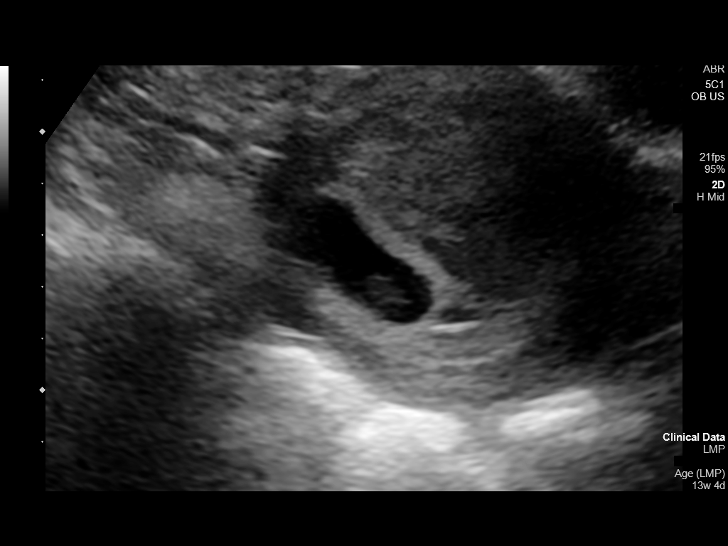
[im 18/98]
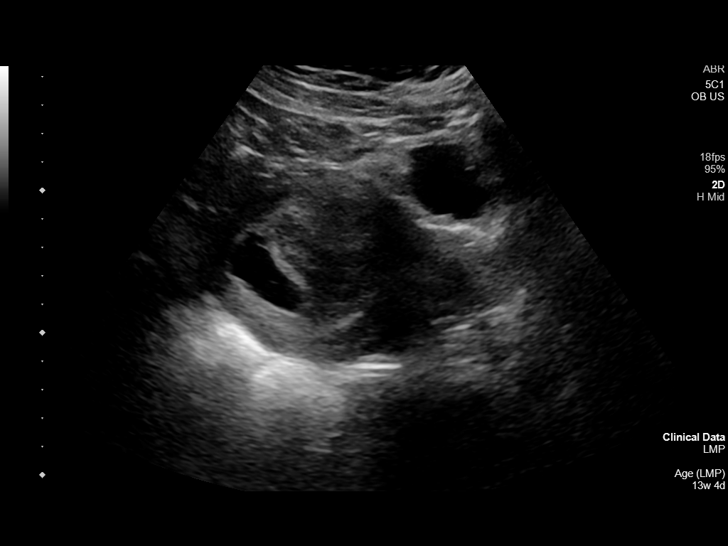
[im 26/98]
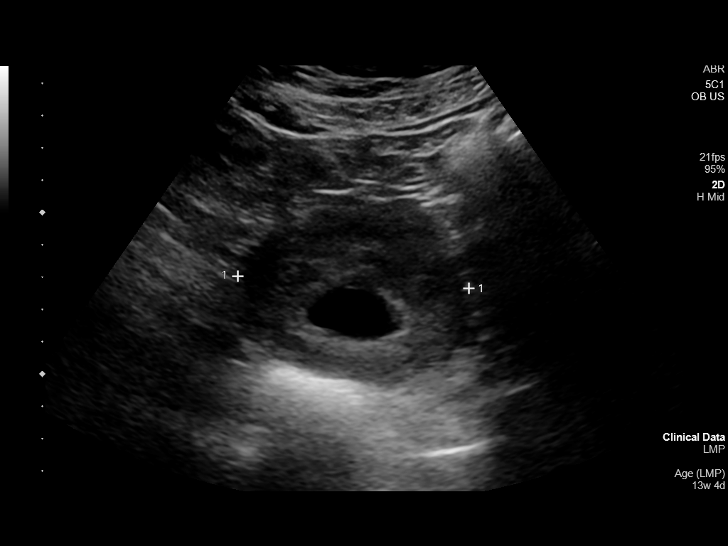
[im 33/98]
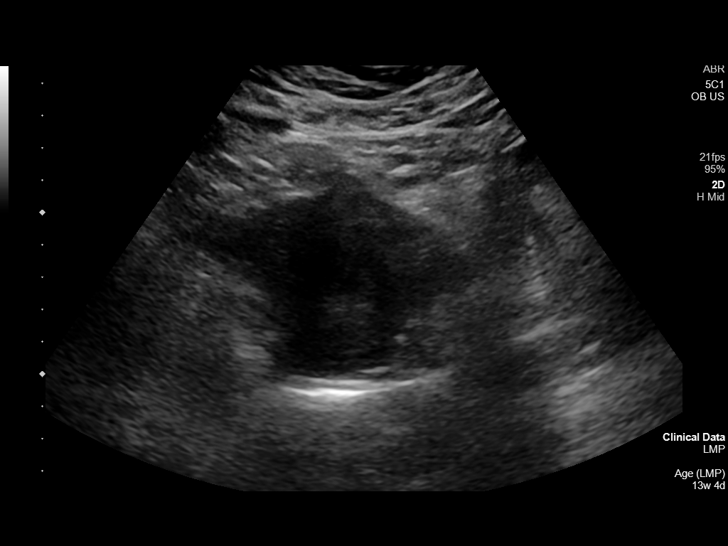
[im 40/98]
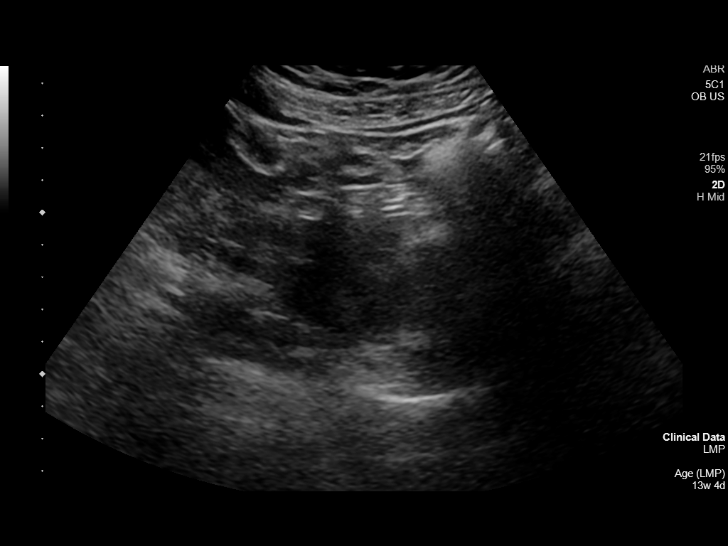
[im 47/98]
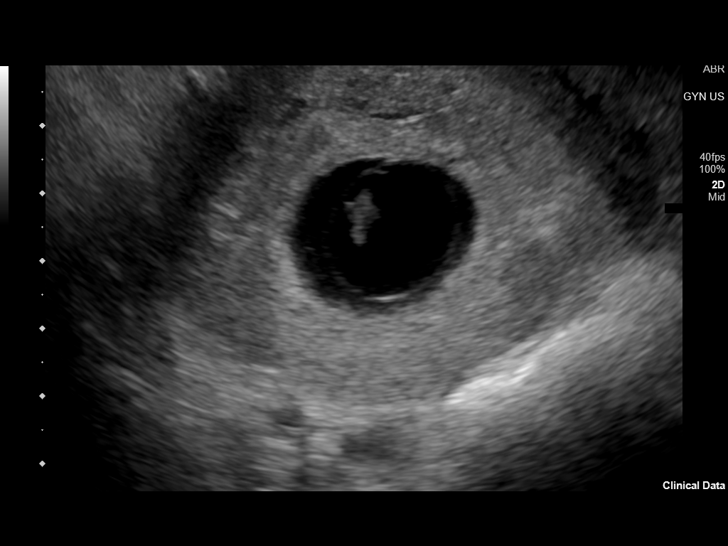
[im 54/98]
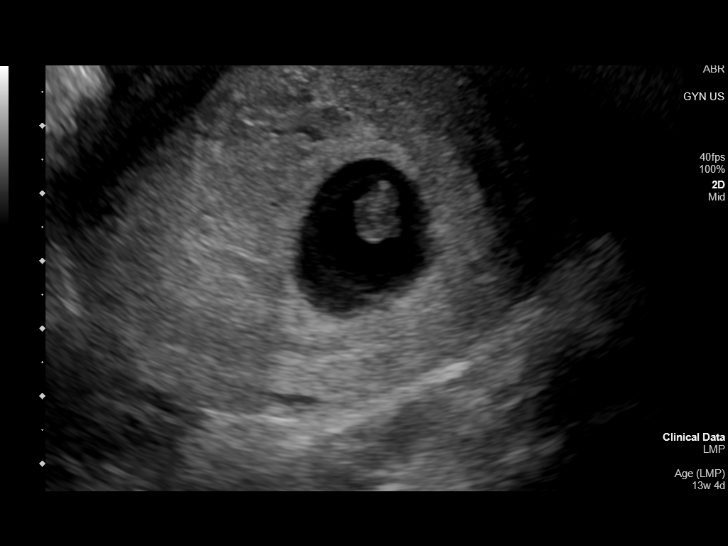
[im 62/98]
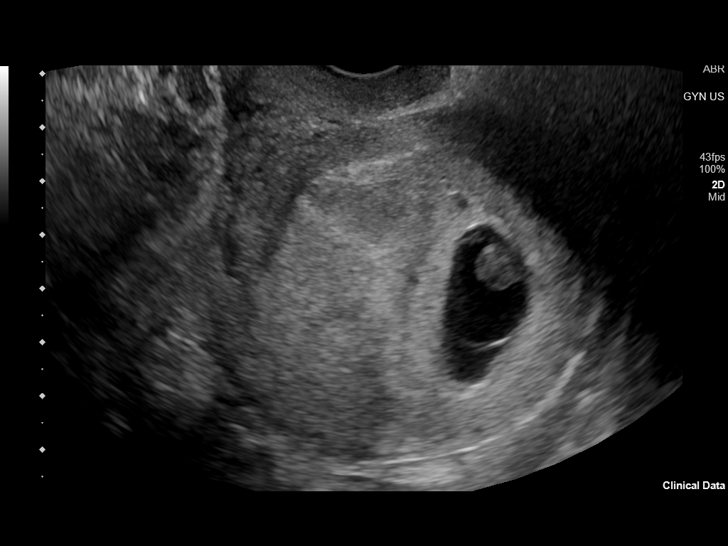
[im 69/98]
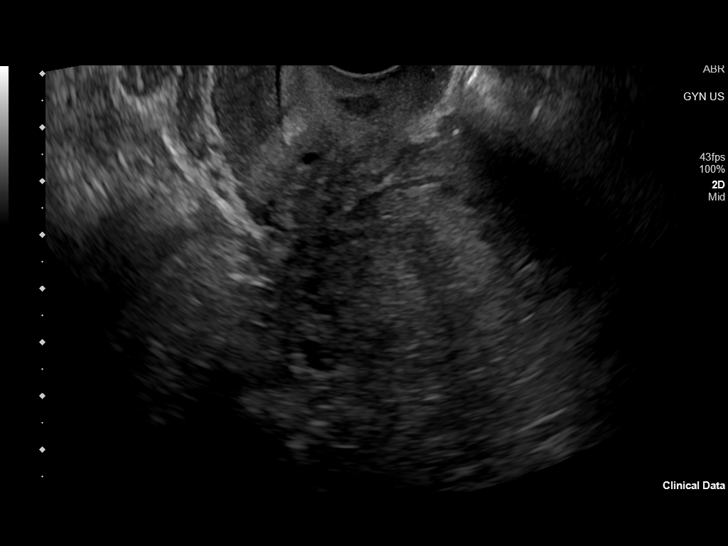
[im 76/98]
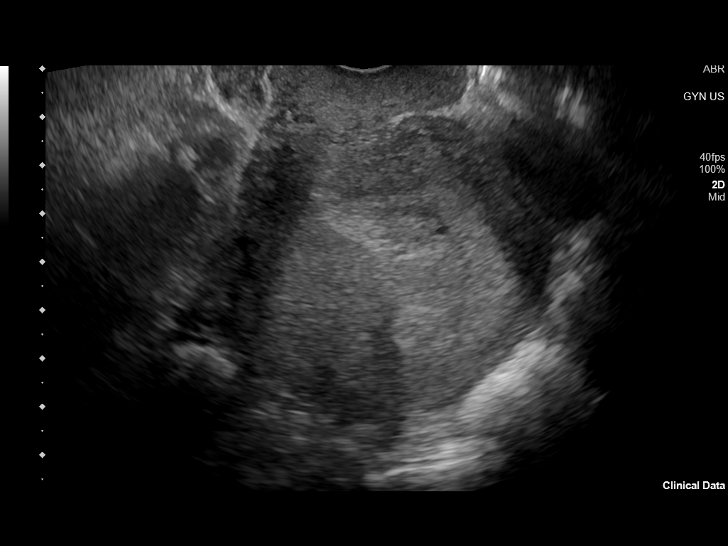
[im 83/98]
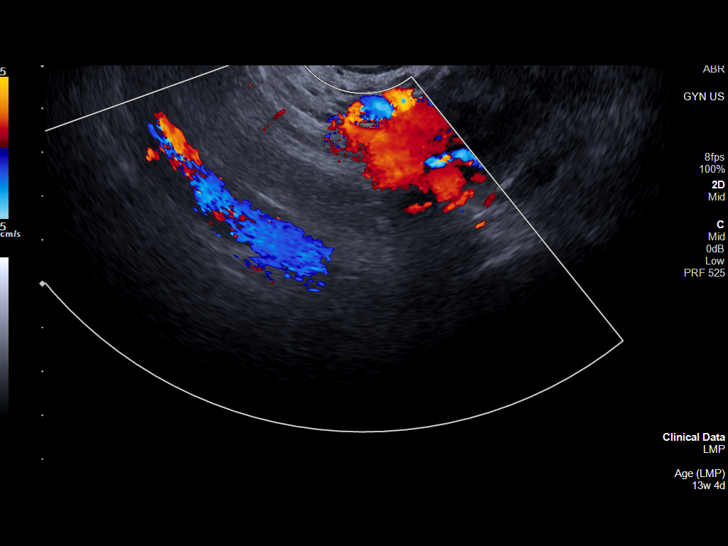
[im 90/98]
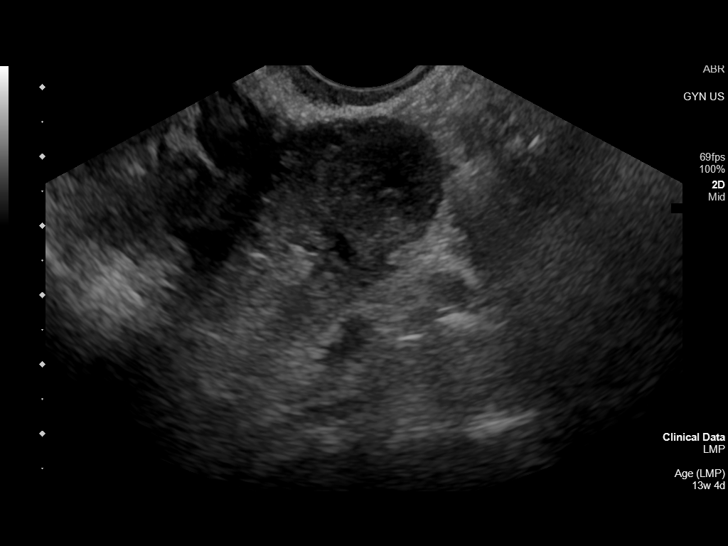
[im 98/98]
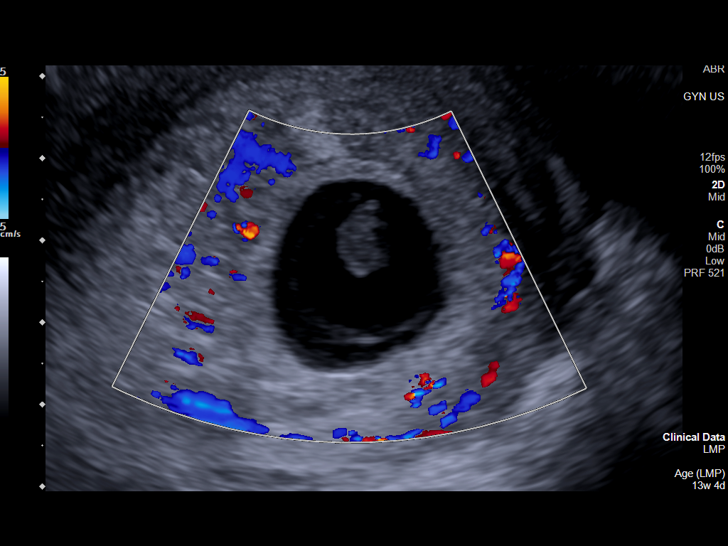

[14 of 28 positions shown; findings below may reference images not displayed]

FINDINGS: Intrauterine gestational sac: Single

Yolk sac:  Present

Embryo:  Present

Cardiac Activity: No visible cardiac activity.

CRL:  9.6 mm   7 w   0 d

Subchorionic hemorrhage:  None visualized.

Maternal uterus/adnexae: Within normal limits.  No free fluid.
IMPRESSION: Single intrauterine gestational sac with slight flattening.
Nonviable embryo without cardiac activity. Crown-rump length
consistent with gestational age of 7 weeks 0 days.
